# Patient Record
Sex: Male | Born: 2005 | Race: White | Hispanic: No | Marital: Single | Smoking: Never smoker
Health system: Southern US, Community
[De-identification: ages and names within clinical notes are randomized; demographics above are authoritative.]

## PROBLEM LIST (undated history)

## (undated) DIAGNOSIS — Q909 Down syndrome, unspecified: Secondary | ICD-10-CM

## (undated) HISTORY — PX: CARDIAC SURGERY: SHX584

---
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
## 2023-02-27 ENCOUNTER — Emergency Department
Admission: EM | Admit: 2023-02-27 | Discharge: 2023-02-27 | Disposition: A | Discharge status: Home / Self Care | Payer: MEDICAID | Attending: Emergency Medicine | Admitting: Emergency Medicine

## 2023-02-27 ENCOUNTER — Other Ambulatory Visit: Payer: Self-pay

## 2023-02-27 DIAGNOSIS — R440 Auditory hallucinations: Secondary | ICD-10-CM | POA: Diagnosis present

## 2023-02-27 LAB — COMPREHENSIVE METABOLIC PANEL
ALT: 34 U/L (ref 0–44)
AST: 31 U/L (ref 15–41)
Albumin: 4.9 g/dL (ref 3.5–5.0)
Alkaline Phosphatase: 76 U/L (ref 38–126)
Anion gap: 13 (ref 5–15)
BUN: 14 mg/dL (ref 6–20)
CO2: 30 mmol/L (ref 22–32)
Calcium: 9.9 mg/dL (ref 8.9–10.3)
Chloride: 98 mmol/L (ref 98–111)
Creatinine, Ser: 1.22 mg/dL (ref 0.61–1.24)
GFR, Estimated: >60 mL/min (ref >60)
Glucose, Bld: 84 mg/dL (ref 70–99)
Potassium: 4.1 mmol/L (ref 3.5–5.1)
Sodium: 141 mmol/L (ref 135–145)
Total Bilirubin: 1.2 mg/dL (ref 0.3–1.2)
Total Protein: 8.6 g/dL — ABNORMAL HIGH (ref 6.5–8.1)

## 2023-02-27 LAB — CBC
HCT: 53.3 % — ABNORMAL HIGH (ref 39.0–52.0)
Hemoglobin: 18.3 g/dL — ABNORMAL HIGH (ref 13.0–17.0)
MCH: 32.2 pg (ref 26.0–34.0)
MCHC: 34.3 g/dL (ref 30.0–36.0)
MCV: 93.7 fL (ref 80.0–100.0)
Platelets: 267 10*3/uL (ref 150–400)
RBC: 5.69 MIL/uL (ref 4.22–5.81)
RDW: 12 % (ref 11.5–15.5)
WBC: 8.2 10*3/uL (ref 4.0–10.5)
nRBC: 0 % (ref 0.0–0.2)

## 2023-02-27 LAB — ACETAMINOPHEN LEVEL: Acetaminophen (Tylenol), Serum: <10 ug/mL — ABNORMAL LOW (ref 10–30)

## 2023-02-27 LAB — SALICYLATE LEVEL: Salicylate Lvl: <7 mg/dL — ABNORMAL LOW (ref 7.0–30.0)

## 2023-02-27 LAB — ETHANOL: Alcohol, Ethyl (B): <10 mg/dL (ref <10)

## 2023-02-27 NOTE — ED Triage Notes (Signed)
Pt to ed from home via POV with mother for pt who is hearing voices. Pt has "been talking to someone who is invisible" for awhile per mom. Pt has seen a therapist virtually in the past. Pt was prescribed clonidine at night. Pt is alert and oriented, Pt has HX of down syndrome.  Pt admitts to hearing voices for the last 5 years. Pt denies SI/HI at this time.  Mother is FT caregiver Migdalia Dk [phone]

## 2023-02-27 NOTE — ED Notes (Signed)
Pt dressed out: Black crocs Black socks Red shirt Khaki pants Black underwear  Wallet and necklace given to mother.

## 2023-02-27 NOTE — ED Provider Notes (Signed)
[facility] Provider Note    Event Date/Time   First MD Initiated Contact with Patient 02/27/23 2058     (approximate)   History   Psychiatric Evaluation   HPI  Lawrence Hubbard is a 17 y.o. male with history of Down syndrome and prolonged history of hearing voices who presents with mother for evaluation.  Mother reports that at 1 point patient was doing teletherapy but that did not seem to help with the voices that he was hearing.  Today he said that "the devil was telling him to break things "but he is saying that he would never do that and never hurt anyone.  His mother believes him and does not feel threatened by him.     Physical Exam   Triage Vital Signs: ED Triage Vitals  Encounter Vitals Group     BP 02/27/23 2046 137/89     Systolic BP Percentile --      Diastolic BP Percentile --      Pulse Rate 02/27/23 2046 73     Resp 02/27/23 2046 16     Temp 02/27/23 2046 98.1 F (36.7 C)     Temp Source 02/27/23 2046 Oral     SpO2 02/27/23 2046 98 %     Weight 02/27/23 2047 65.8 kg (145 lb)     Height 02/27/23 2047 1.6 m (5\' 3" )     Head Circumference --      Peak Flow --      Pain Score 02/27/23 2047 0     Pain Loc --      Pain Education --      Exclude from Growth Chart --     Most recent vital signs: Vitals:   02/27/23 2046 02/27/23 2135  BP: 137/89 129/84  Pulse: 73 71  Resp: 16 17  Temp: 98.1 F (36.7 C) 98.3 F (36.8 C)  SpO2: 98% 99%     General: Awake, no distress.  CV:  Good peripheral perfusion.  Resp:  Normal effort.  Abd:  No distention.  Other:  Calm and cooperative, polite, demonstrating his love of [employer], [employer] and discussing his girlfriend with me.   ED Results / Procedures / Treatments   Labs (all labs ordered are listed, but only abnormal results are displayed) Labs Reviewed  COMPREHENSIVE METABOLIC PANEL - Abnormal; Notable for the following components:      Result Value   Total Protein 8.6 (*)    All  other components within normal limits  SALICYLATE LEVEL - Abnormal; Notable for the following components:   Salicylate Lvl <7.0 (*)    All other components within normal limits  ACETAMINOPHEN LEVEL - Abnormal; Notable for the following components:   Acetaminophen (Tylenol), Serum <10 (*)    All other components within normal limits  CBC - Abnormal; Notable for the following components:   Hemoglobin 18.3 (*)    HCT 53.3 (*)    All other components within normal limits  ETHANOL  URINE DRUG SCREEN, QUALITATIVE ([facility] ONLY)     EKG     RADIOLOGY     PROCEDURES:  Critical Care performed:   Procedures   MEDICATIONS ORDERED IN ED: Medications - No data to display   IMPRESSION / MDM / ASSESSMENT AND PLAN / ED COURSE  I reviewed the triage vital signs and the nursing notes. Patient's presentation is most consistent with exacerbation of chronic illness.  Patient has been hearing voices for quite some time, has seen psychiatry  for this in the past apparently but has not had resolution.  Does not appear to have any SI or HI, mother does not feel threatened by him.  This appears to be a continuation of his chronic symptoms and not an extreme exacerbation.  Discussed at length with mother that patient does not need hospitalization at this time and that would be better served with outpatient psychiatry, she agrees with this plan, will refer to [facility]  Lab work reviewed and is all unremarkable      FINAL CLINICAL IMPRESSION(S) / ED DIAGNOSES   Final diagnoses:  Hearing voices     Rx / DC Orders   ED Discharge Orders     None        Note:  This document was prepared using Dragon voice recognition software and may include unintentional dictation errors.   Jene Every, MD 02/27/23 2145

## 2023-02-27 NOTE — Discharge Instructions (Signed)
Please follow up with [facility] as we discussed

## 2023-03-02 NOTE — Group Note (Deleted)
